# Patient Record
Sex: Male | Born: 1960
Health system: Southern US, Community
[De-identification: ages and names within clinical notes are randomized; demographics above are authoritative.]

## PROBLEM LIST (undated history)

## (undated) DIAGNOSIS — I10 Essential (primary) hypertension: Secondary | ICD-10-CM

## (undated) DIAGNOSIS — E785 Hyperlipidemia, unspecified: Secondary | ICD-10-CM

---

## 1993-09-17 HISTORY — PX: APPENDECTOMY: SHX54

## 1999-07-12 ENCOUNTER — Ambulatory Visit (HOSPITAL_COMMUNITY): Admission: RE | Admit: 1999-07-12 | Discharge: 1999-07-12 | Payer: Self-pay | Admitting: Family Medicine

## 2016-10-01 DIAGNOSIS — Z0000 Encounter for general adult medical examination without abnormal findings: Secondary | ICD-10-CM | POA: Diagnosis not present

## 2016-10-01 DIAGNOSIS — Z789 Other specified health status: Secondary | ICD-10-CM | POA: Diagnosis not present

## 2016-10-01 DIAGNOSIS — R5383 Other fatigue: Secondary | ICD-10-CM | POA: Diagnosis not present

## 2016-10-01 DIAGNOSIS — Z713 Dietary counseling and surveillance: Secondary | ICD-10-CM | POA: Diagnosis not present

## 2016-10-01 DIAGNOSIS — Z79899 Other long term (current) drug therapy: Secondary | ICD-10-CM | POA: Diagnosis not present

## 2016-10-01 DIAGNOSIS — I10 Essential (primary) hypertension: Secondary | ICD-10-CM | POA: Diagnosis not present

## 2016-10-01 DIAGNOSIS — Z299 Encounter for prophylactic measures, unspecified: Secondary | ICD-10-CM | POA: Diagnosis not present

## 2016-10-01 DIAGNOSIS — Z125 Encounter for screening for malignant neoplasm of prostate: Secondary | ICD-10-CM | POA: Diagnosis not present

## 2016-10-01 DIAGNOSIS — E7800 Pure hypercholesterolemia, unspecified: Secondary | ICD-10-CM | POA: Diagnosis not present

## 2016-10-01 DIAGNOSIS — G4700 Insomnia, unspecified: Secondary | ICD-10-CM | POA: Diagnosis not present

## 2017-03-29 DIAGNOSIS — Z1211 Encounter for screening for malignant neoplasm of colon: Secondary | ICD-10-CM | POA: Diagnosis not present

## 2017-03-29 DIAGNOSIS — Z1389 Encounter for screening for other disorder: Secondary | ICD-10-CM | POA: Diagnosis not present

## 2017-03-29 DIAGNOSIS — Z0000 Encounter for general adult medical examination without abnormal findings: Secondary | ICD-10-CM | POA: Diagnosis not present

## 2017-04-22 DIAGNOSIS — G4700 Insomnia, unspecified: Secondary | ICD-10-CM | POA: Diagnosis not present

## 2017-05-07 DIAGNOSIS — M25572 Pain in left ankle and joints of left foot: Secondary | ICD-10-CM | POA: Diagnosis not present

## 2017-05-17 DIAGNOSIS — G4700 Insomnia, unspecified: Secondary | ICD-10-CM | POA: Diagnosis not present

## 2017-06-05 DIAGNOSIS — I10 Essential (primary) hypertension: Secondary | ICD-10-CM | POA: Diagnosis not present

## 2017-06-05 DIAGNOSIS — F5101 Primary insomnia: Secondary | ICD-10-CM | POA: Diagnosis not present

## 2017-06-05 DIAGNOSIS — E782 Mixed hyperlipidemia: Secondary | ICD-10-CM | POA: Diagnosis not present

## 2017-07-12 DIAGNOSIS — E782 Mixed hyperlipidemia: Secondary | ICD-10-CM | POA: Diagnosis not present

## 2017-07-12 DIAGNOSIS — I10 Essential (primary) hypertension: Secondary | ICD-10-CM | POA: Diagnosis not present

## 2017-10-29 DIAGNOSIS — Z23 Encounter for immunization: Secondary | ICD-10-CM | POA: Diagnosis not present

## 2017-10-29 DIAGNOSIS — F5101 Primary insomnia: Secondary | ICD-10-CM | POA: Diagnosis not present

## 2017-10-29 DIAGNOSIS — L309 Dermatitis, unspecified: Secondary | ICD-10-CM | POA: Diagnosis not present

## 2017-12-03 DIAGNOSIS — G4700 Insomnia, unspecified: Secondary | ICD-10-CM | POA: Diagnosis not present

## 2018-03-18 DIAGNOSIS — G4700 Insomnia, unspecified: Secondary | ICD-10-CM | POA: Diagnosis not present

## 2018-05-05 DIAGNOSIS — Z1159 Encounter for screening for other viral diseases: Secondary | ICD-10-CM | POA: Diagnosis not present

## 2018-05-05 DIAGNOSIS — L2082 Flexural eczema: Secondary | ICD-10-CM | POA: Diagnosis not present

## 2018-05-05 DIAGNOSIS — Z0000 Encounter for general adult medical examination without abnormal findings: Secondary | ICD-10-CM | POA: Diagnosis not present

## 2018-05-05 DIAGNOSIS — Z125 Encounter for screening for malignant neoplasm of prostate: Secondary | ICD-10-CM | POA: Diagnosis not present

## 2018-05-05 DIAGNOSIS — E782 Mixed hyperlipidemia: Secondary | ICD-10-CM | POA: Diagnosis not present

## 2018-05-05 DIAGNOSIS — Z23 Encounter for immunization: Secondary | ICD-10-CM | POA: Diagnosis not present

## 2018-05-05 DIAGNOSIS — I10 Essential (primary) hypertension: Secondary | ICD-10-CM | POA: Diagnosis not present

## 2018-10-25 DIAGNOSIS — J101 Influenza due to other identified influenza virus with other respiratory manifestations: Secondary | ICD-10-CM | POA: Diagnosis not present

## 2018-10-31 ENCOUNTER — Emergency Department (HOSPITAL_COMMUNITY): Payer: 59

## 2018-10-31 ENCOUNTER — Encounter (HOSPITAL_COMMUNITY): Payer: Self-pay | Admitting: Emergency Medicine

## 2018-10-31 ENCOUNTER — Emergency Department (HOSPITAL_COMMUNITY)
Admission: EM | Admit: 2018-10-31 | Discharge: 2018-10-31 | Disposition: A | Discharge status: Home / Self Care | Payer: 59 | Attending: Emergency Medicine | Admitting: Emergency Medicine

## 2018-10-31 DIAGNOSIS — R05 Cough: Secondary | ICD-10-CM

## 2018-10-31 DIAGNOSIS — I10 Essential (primary) hypertension: Secondary | ICD-10-CM | POA: Insufficient documentation

## 2018-10-31 DIAGNOSIS — Z79899 Other long term (current) drug therapy: Secondary | ICD-10-CM | POA: Insufficient documentation

## 2018-10-31 DIAGNOSIS — R059 Cough, unspecified: Secondary | ICD-10-CM

## 2018-10-31 DIAGNOSIS — E785 Hyperlipidemia, unspecified: Secondary | ICD-10-CM | POA: Diagnosis not present

## 2018-10-31 DIAGNOSIS — E876 Hypokalemia: Secondary | ICD-10-CM

## 2018-10-31 DIAGNOSIS — R531 Weakness: Secondary | ICD-10-CM | POA: Diagnosis not present

## 2018-10-31 HISTORY — DX: Hyperlipidemia, unspecified: E78.5

## 2018-10-31 HISTORY — DX: Essential (primary) hypertension: I10

## 2018-10-31 LAB — CBC WITH DIFFERENTIAL/PLATELET
Abs Immature Granulocytes: 0.05 10*3/uL (ref 0.00–0.07)
BASOS ABS: 0 10*3/uL (ref 0.0–0.1)
Basophils Relative: 0 %
Eosinophils Absolute: 0 10*3/uL (ref 0.0–0.5)
Eosinophils Relative: 0 %
HCT: 38.3 % — ABNORMAL LOW (ref 39.0–52.0)
Hemoglobin: 12.2 g/dL — ABNORMAL LOW (ref 13.0–17.0)
IMMATURE GRANULOCYTES: 1 %
LYMPHS ABS: 2.2 10*3/uL (ref 0.7–4.0)
Lymphocytes Relative: 28 %
MCH: 26.5 pg (ref 26.0–34.0)
MCHC: 31.9 g/dL (ref 30.0–36.0)
MCV: 83.3 fL (ref 80.0–100.0)
MONOS PCT: 5 %
Monocytes Absolute: 0.4 10*3/uL (ref 0.1–1.0)
NRBC: 0 % (ref 0.0–0.2)
Neutro Abs: 5.2 10*3/uL (ref 1.7–7.7)
Neutrophils Relative %: 66 %
Platelets: 269 10*3/uL (ref 150–400)
RBC: 4.6 MIL/uL (ref 4.22–5.81)
RDW: 12.5 % (ref 11.5–15.5)
WBC: 7.9 10*3/uL (ref 4.0–10.5)

## 2018-10-31 LAB — I-STAT TROPONIN, ED: Troponin i, poc: 0.01 ng/mL (ref 0.00–0.08)

## 2018-10-31 LAB — COMPREHENSIVE METABOLIC PANEL
ALT: 81 U/L — ABNORMAL HIGH (ref 0–44)
AST: 81 U/L — ABNORMAL HIGH (ref 15–41)
Albumin: 3.2 g/dL — ABNORMAL LOW (ref 3.5–5.0)
Alkaline Phosphatase: 86 U/L (ref 38–126)
Anion gap: 14 (ref 5–15)
BUN: 8 mg/dL (ref 6–20)
CO2: 27 mmol/L (ref 22–32)
Calcium: 8.9 mg/dL (ref 8.9–10.3)
Chloride: 92 mmol/L — ABNORMAL LOW (ref 98–111)
Creatinine, Ser: 1.11 mg/dL (ref 0.61–1.24)
GFR calc Af Amer: >60 mL/min (ref >60)
GFR calc non Af Amer: >60 mL/min (ref >60)
Glucose, Bld: 126 mg/dL — ABNORMAL HIGH (ref 70–99)
Potassium: 2.4 mmol/L — CL (ref 3.5–5.1)
Sodium: 133 mmol/L — ABNORMAL LOW (ref 135–145)
Total Bilirubin: 1.1 mg/dL (ref 0.3–1.2)
Total Protein: 7.9 g/dL (ref 6.5–8.1)

## 2018-10-31 LAB — MAGNESIUM: Magnesium: 2.5 mg/dL — ABNORMAL HIGH (ref 1.7–2.4)

## 2018-10-31 LAB — BASIC METABOLIC PANEL
Anion gap: 15 (ref 5–15)
BUN: 8 mg/dL (ref 6–20)
CO2: 28 mmol/L (ref 22–32)
Calcium: 9 mg/dL (ref 8.9–10.3)
Chloride: 89 mmol/L — ABNORMAL LOW (ref 98–111)
Creatinine, Ser: 1.09 mg/dL (ref 0.61–1.24)
GFR calc Af Amer: >60 mL/min (ref >60)
GLUCOSE: 95 mg/dL (ref 70–99)
Potassium: 2.9 mmol/L — ABNORMAL LOW (ref 3.5–5.1)
Sodium: 132 mmol/L — ABNORMAL LOW (ref 135–145)

## 2018-10-31 MED ORDER — POTASSIUM CHLORIDE CRYS ER 20 MEQ PO TBCR
40.0000 meq | EXTENDED_RELEASE_TABLET | Freq: Once | ORAL | Status: AC
  Administered 2018-10-31: 40 meq via ORAL
  Filled 2018-10-31: qty 2

## 2018-10-31 MED ORDER — POTASSIUM CHLORIDE 10 MEQ/100ML IV SOLN
10.0000 meq | Freq: Once | INTRAVENOUS | Status: AC
  Administered 2018-10-31: 10 meq via INTRAVENOUS
  Filled 2018-10-31: qty 100

## 2018-10-31 MED ORDER — MAGNESIUM SULFATE 2 GM/50ML IV SOLN
2.0000 g | Freq: Once | INTRAVENOUS | Status: DC
  Filled 2018-10-31: qty 50

## 2018-10-31 MED ORDER — SODIUM CHLORIDE 0.9 % IV BOLUS
1000.0000 mL | Freq: Once | INTRAVENOUS | Status: AC
  Administered 2018-10-31: 1000 mL via INTRAVENOUS

## 2018-10-31 MED ORDER — POTASSIUM CHLORIDE 10 MEQ/100ML IV SOLN: 10.0000 meq | Freq: Once | INTRAVENOUS | Status: DC

## 2018-10-31 MED ORDER — DOXYCYCLINE HYCLATE 50 MG PO CAPS: 50.0000 mg | ORAL_CAPSULE | Freq: Two times a day (BID) | ORAL | 0 refills | Status: AC

## 2018-10-31 MED ORDER — POTASSIUM CHLORIDE ER 10 MEQ PO TBCR: 20.0000 meq | EXTENDED_RELEASE_TABLET | Freq: Two times a day (BID) | ORAL | 0 refills | Status: AC

## 2018-10-31 MED ORDER — AMOXICILLIN 500 MG PO CAPS: 1000.0000 mg | ORAL_CAPSULE | Freq: Two times a day (BID) | ORAL | 0 refills | Status: AC

## 2018-10-31 NOTE — Discharge Instructions (Addendum)
You have been seen today for cough and low potassium. Please read and follow all provided instructions.   1. Medications: Amoxicillin and Doxycyline (antibiotics), potassium, usual home medications 2. Treatment: rest, drink plenty of fluids 3. Follow Up: Please follow up with your primary doctor in 2-3 days for discussion of your diagnoses and further evaluation after today's visit; if you do not have a primary care doctor use the resource guide provided to find one; Please return to the ER for any new or worsening symptoms. Please obtain all of your results from medical records or have your doctors office obtain the results - share them with your doctor - you should be seen at your doctors office. Call today to arrange your follow up.   Take medications as prescribed. Please review all of the medicines and only take them if you do not have an allergy to them. Return to the emergency room for worsening condition or new concerning symptoms. Follow up with your regular doctor. If you don't have a regular doctor use one of the numbers below to establish a primary care doctor.  Please be aware that if you are taking birth control pills, taking other prescriptions, ESPECIALLY ANTIBIOTICS may make the birth control ineffective - if this is the case, either do not engage in sexual activity or use alternative methods of birth control such as condoms until you have finished the medicine and your family doctor says it is OK to restart them. If you are on a blood thinner such as COUMADIN, be aware that any other medicine that you take may cause the coumadin to either work too much, or not enough - you should have your coumadin level rechecked in next 7 days if this is the case.  ?  It is also a possibility that you have an allergic reaction to any of the medicines that you have been prescribed - Everybody reacts differently to medications and while MOST people have no trouble with most medicines, you may have a  reaction such as nausea, vomiting, rash, swelling, shortness of breath. If this is the case, please stop taking the medicine immediately and contact your physician.  ?  You should return to the ER if you develop severe or worsening symptoms.   Emergency Department Resource Guide 1) Find a Doctor and Pay Out of Pocket Although you won't have to find out who is covered by your insurance plan, it is a good idea to ask around and get recommendations. You will then need to call the office and see if the doctor you have chosen will accept you as a new patient and what types of options they offer for patients who are self-pay. Some doctors offer discounts or will set up payment plans for their patients who do not have insurance, but you will need to ask so you aren't surprised when you get to your appointment.  2) Contact Your Local Health Department Not all health departments have doctors that can see patients for sick visits, but many do, so it is worth a call to see if yours does. If you don't know where your local health department is, you can check in your phone book. The CDC also has a tool to help you locate your state's health department, and many state websites also have listings of all of their local health departments.  3) Find a Walk-in Clinic If your illness is not likely to be very severe or complicated, you may want to try a walk in  clinic. These are popping up all over the country in pharmacies, drugstores, and shopping centers. They're usually staffed by nurse practitioners or physician assistants that have been trained to treat common illnesses and complaints. They're usually fairly quick and inexpensive. However, if you have serious medical issues or chronic medical problems, these are probably not your best option.  No Primary Care Doctor: Call Health Connect at  [phone] - they can help you locate a primary care doctor that  accepts your insurance, provides certain services,  etc. Physician Referral [phone]  Emergency Department Resource Guide 1) Find a Doctor and Pay Out of Pocket Although you won't have to find out who is covered by your insurance plan, it is a good idea to ask around and get recommendations. You will then need to call the office and see if the doctor you have chosen will accept you as a new patient and what types of options they offer for patients who are self-pay. Some doctors offer discounts or will set up payment plans for their patients who do not have insurance, but you will need to ask so you aren't surprised when you get to your appointment.  2) Contact Your Local Health Department Not all health departments have doctors that can see patients for sick visits, but many do, so it is worth a call to see if yours does. If you don't know where your local health department is, you can check in your phone book. The CDC also has a tool to help you locate your state's health department, and many state websites also have listings of all of their local health departments.  3) Find a Walk-in Clinic If your illness is not likely to be very severe or complicated, you may want to try a walk in clinic. These are popping up all over the country in pharmacies, drugstores, and shopping centers. They're usually staffed by nurse practitioners or physician assistants that have been trained to treat common illnesses and complaints. They're usually fairly quick and inexpensive. However, if you have serious medical issues or chronic medical problems, these are probably not your best option.  No Primary Care Doctor: Call Health Connect at  [phone] - they can help you locate a primary care doctor that  accepts your insurance, provides certain services, etc. Physician Referral Service- [phone]  Chronic Pain Problems: Organization         Address  Phone   Notes  [facility]  [phone] Patients need to be referred by their  primary care doctor.   Medication Assistance: Organization         Address  Phone   Notes  [facility] Medication [facility] [address] [place] [place], [place] [address] [phone] --Must be a resident of [facility] -- Must have NO insurance coverage whatsoever (no Medicaid/ Medicare, etc.) -- The pt. MUST have a primary care doctor that directs their care regularly and follows them in the community   [facility]  [phone]   [employer]  [phone]    Agencies that provide inexpensive medical care: Organization         Address  Phone   Notes  [facility]  [phone]   [facility]    [phone]   [facility] [address] [place], [place] [address] [phone]   [facility] [address] [place] [place] [phone]   [facility]    (  [phone]   [facility]    [phone]   [facility]  [address], [place] Phone:  [phone], Fax:  [phone] Hours of Operation:  9 am - 6 pm, M-F.  Also accepts Medicaid/Medicare and self-pay.  [facility] for [address], [address], [place] Phone: [phone], Fax: [phone]. Hours of Operation:  8:30 am - 5:30 pm, M-F.  Also accepts Medicaid and self-pay.  [facility] [place] [address] [place] Phone: [phone]   [place], [place], [place] [phone], Ext. [phone] Mondays & Thursdays: 7-9 AM.  First 15 patients are seen on a first come, first serve basis.    [place] Providers:  Organization         Address  Phone   Notes  [facility] [address], [place] [phone] Also accepts self-pay patients.  [facility] [address], [place]  [phone]   [place], [address], [place] [phone]   [facility] [address] [place] [phone]   Lucianne Lei [address], [place]   [phone] Only accepts [place] [facility] [place] patients after they have their name applied to their card.   Self-Pay (no insurance) in [facility]:  Organization         Address  Phone   Notes  Sickle Cell Patients, [facility] [place] [phone]   [facility] [place] [phone]   [facility] [place]  [place], [place], [place] [phone]   [facility]/Dr. Osei-Bonsu  [address], [place] or [address], [place] [phone] Phone number for both [place] and [place] locations is the same.  [facility] and [facility] [address] [place] [phone]   [facility] [address], [place] or [address] [phone]   [facility] [address] [place] [phone], phone; [phone], fax Sees patients 1st and 3rd Saturday of every month.  Must not qualify for public or private insurance (i.e. Medicaid, Medicare, [place] Health Choice, Veterans' Benefits)  Household income should be no more than 200% of the poverty level The clinic cannot treat you if you are pregnant or think you are pregnant  Sexually transmitted diseases are not treated at the clinic.

## 2018-10-31 NOTE — Progress Notes (Signed)
PIV consult: IV site established by ED RN.

## 2018-10-31 NOTE — ED Notes (Signed)
Patient unable to provide urine sample at this time - provided water.

## 2018-10-31 NOTE — ED Triage Notes (Signed)
Patient c/o fatigue, dry, non-productive cough, sinus pressure/headache, nausea, and congestion x 1 week, no relief with OTC medications. Denies fevers/chills. Resp e/u, skin w/d.

## 2018-10-31 NOTE — ED Provider Notes (Signed)
[facility] Provider Note   CSN: [id number] Arrival date & time: 10/31/18  1030     History   Chief Complaint Chief Complaint  Patient presents with  . Fatigue  . Cough    HPI Phillip Lara is a 58 y.o. male with a PMH of HTN and HLD presenting with a dry cough, congestion, rhinorrhea, and fatigue onset 1 week ago. Patient reports loss of appetite. Wife is a contributing historian. Patient reports intermittent frontal headaches described as sinus pressure and states symptoms are similar to previous headaches. Patient denies a current headache. Patient denies dizziness, weakness, or numbness. Patient reports taking Alka-seltzer, Motrin, and Nyquil with minimal relief. Patient reports a few episodes of diarrhea a few days ago, but states symptoms resolved. Patient denies fever, chills, body aches, or sore throat. Patient reports intermittent nausea a few days ago, but states this has resolved as well. Patient denies vomiting, or abdominal pain. Patient denies dysuria or frequency. Patient denies shortness of breath or chest pain. Patient states he has had sick exposures at work. Patient reports he was evaluated on the phone by his company's physician and states he was prescribed Tamiflu. Patient reports he started Tamiflu on 2/9.  HPI  Past Medical History:  Diagnosis Date  . Hyperlipidemia   . Hypertension     There are no active problems to display for this patient.   Past Surgical History:  Procedure Laterality Date  . APPENDECTOMY  1995        Home Medications    Prior to Admission medications   Medication Sig Start Date End Date Taking? Authorizing Provider  acetaminophen (TYLENOL) 500 MG tablet Take 1,000 mg by mouth every 6 (six) hours as needed for mild pain or headache.   Yes [provider]  diphenhydramine-acetaminophen (TYLENOL PM) 25-500 MG TABS tablet Take 1 tablet by mouth at bedtime as needed (sleep).   Yes  [provider]  DM-Doxylamine-Acetaminophen (NYQUIL HBP COLD & FLU) 15-6.25-325 MG/15ML LIQD Take 15 mLs by mouth every 6 (six) hours as needed (flu symptoms).   Yes [provider]  ibuprofen (ADVIL,MOTRIN) 200 MG tablet Take 200 mg by mouth every 6 (six) hours as needed for headache or moderate pain.   Yes [provider]  lisinopril-hydrochlorothiazide (PRINZIDE,ZESTORETIC) 20-25 MG tablet Take 1 tablet by mouth daily. 09/20/18  Yes [provider]  loperamide (IMODIUM) 2 MG capsule Take 4 mg by mouth as needed for diarrhea or loose stools.   Yes [provider]  lovastatin (MEVACOR) 20 MG tablet Take 20 mg by mouth daily. With evening meal 09/22/18  Yes [provider]  oseltamivir (TAMIFLU) 75 MG capsule Take 75 mg by mouth 2 (two) times daily. 10/25/18  Yes [provider]  vitamin C (ASCORBIC ACID) 500 MG tablet Take 500 mg by mouth daily.   Yes [provider]  amoxicillin (AMOXIL) 500 MG capsule Take 2 capsules (1,000 mg total) by mouth 2 (two) times daily for 10 days. 10/31/18 11/10/18  Carlyle BasquesHernandez, Ruthann Angulo P, PA-C  doxycycline (VIBRAMYCIN) 50 MG capsule Take 1 capsule (50 mg total) by mouth 2 (two) times daily for 7 days. 10/31/18 11/07/18  Carlyle BasquesHernandez, Shakeda Pearse P, PA-C  potassium chloride (K-DUR) 10 MEQ tablet Take 2 tablets (20 mEq total) by mouth 2 (two) times daily for 5 days. 10/31/18 11/05/18  Leretha DykesHernandez, Yarden Hillis P, PA-C    Family History No family history on file.  Social History Social History   Tobacco Use  .  Smoking status: Never Smoker  . Smokeless tobacco: Never Used  Substance Use Topics  . Alcohol use: Yes    Comment: rare  . Drug use: Never     Allergies   Patient has no known allergies.   Review of Systems Review of Systems  Constitutional: Positive for appetite change and fatigue. Negative for activity change, chills, diaphoresis and fever.  HENT: Positive for congestion and rhinorrhea. Negative for ear pain,  postnasal drip and sore throat.   Eyes: Negative for photophobia, pain, redness, itching and visual disturbance.  Respiratory: Positive for cough. Negative for shortness of breath.   Cardiovascular: Negative for chest pain.  Gastrointestinal: Positive for diarrhea and nausea. Negative for abdominal pain, blood in stool, constipation and vomiting.  Genitourinary: Negative for dysuria and frequency.  Musculoskeletal: Negative for myalgias, neck pain and neck stiffness.  Skin: Negative for rash.  Allergic/Immunologic: Negative for environmental allergies and immunocompromised state.  Neurological: Positive for headaches. Negative for dizziness, syncope, speech difficulty, weakness and numbness.     Physical Exam Updated Vital Signs BP 119/70 (BP Location: Right Arm)   Pulse 80   Temp 98 F (36.7 C) (Oral)   Resp 17   SpO2 96%   Physical Exam Vitals signs and nursing note reviewed.  Constitutional:      General: He is not in acute distress.    Appearance: He is well-developed. He is not diaphoretic.  HENT:     Head: Normocephalic and atraumatic.     Right Ear: Tympanic membrane and external ear normal. No middle ear effusion.     Left Ear: Tympanic membrane and external ear normal.  No middle ear effusion.     Nose: Mucosal edema, congestion and rhinorrhea present.     Right Sinus: No maxillary sinus tenderness or frontal sinus tenderness.     Left Sinus: No maxillary sinus tenderness or frontal sinus tenderness.     Mouth/Throat:     Mouth: Mucous membranes are moist.     Pharynx: Uvula midline. Posterior oropharyngeal erythema present. No oropharyngeal exudate.  Eyes:     General:        Right eye: No discharge.        Left eye: No discharge.     Extraocular Movements: Extraocular movements intact.     Conjunctiva/sclera: Conjunctivae normal.     Pupils: Pupils are equal, round, and reactive to light.  Neck:     Musculoskeletal: Normal range of motion and neck supple.    Cardiovascular:     Rate and Rhythm: Normal rate and regular rhythm.     Heart sounds: Normal heart sounds. No murmur. No friction rub. No gallop.   Pulmonary:     Effort: Pulmonary effort is normal. No respiratory distress.     Breath sounds: Examination of the right-middle field reveals decreased breath sounds. Examination of the right-lower field reveals decreased breath sounds. Decreased breath sounds present. No wheezing, rhonchi or rales.  Abdominal:     Palpations: Abdomen is soft.     Tenderness: There is no abdominal tenderness.  Musculoskeletal: Normal range of motion.  Lymphadenopathy:     Cervical: No cervical adenopathy.  Skin:    Findings: No rash.  Neurological:     Mental Status: He is alert and oriented to person, place, and time.    Mental Status:  Alert, oriented, thought content appropriate, able to give a coherent history. Speech fluent without evidence of aphasia. Able to follow 2 step commands without difficulty.  Cranial  Nerves:  II:  Peripheral visual fields grossly normal, pupils equal, round, reactive to light III,IV, VI: ptosis not present, extra-ocular motions intact bilaterally  V,VII: smile symmetric, facial light touch sensation equal VIII: hearing grossly normal to voice  X: uvula elevates symmetrically  XI: bilateral shoulder shrug symmetric and strong XII: midline tongue extension without fassiculations Motor:  Normal tone. 5/5 in upper and lower extremities bilaterally including strong and equal grip strength and dorsiflexion/plantar flexion Sensory: light touch normal in all extremities.  Deep Tendon Reflexes: 2+ and symmetric in the biceps and patella Cerebellar: normal finger-to-nose with bilateral upper extremities Gait: normal gait and balance.  Negative pronator drift. Negative Romberg sign. CV: distal pulses palpable throughout   ED Treatments / Results  Labs (all labs ordered are listed, but only abnormal results are  displayed) Labs Reviewed  COMPREHENSIVE METABOLIC PANEL - Abnormal; Notable for the following components:      Result Value   Sodium 133 (*)    Potassium 2.4 (*)    Chloride 92 (*)    Glucose, Bld 126 (*)    Albumin 3.2 (*)    AST 81 (*)    ALT 81 (*)    All other components within normal limits  CBC WITH DIFFERENTIAL/PLATELET - Abnormal; Notable for the following components:   Hemoglobin 12.2 (*)    HCT 38.3 (*)    All other components within normal limits  MAGNESIUM - Abnormal; Notable for the following components:   Magnesium 2.5 (*)    All other components within normal limits  BASIC METABOLIC PANEL - Abnormal; Notable for the following components:   Sodium 132 (*)    Potassium 2.9 (*)    Chloride 89 (*)    All other components within normal limits  URINALYSIS, ROUTINE W REFLEX MICROSCOPIC  I-STAT TROPONIN, ED    EKG EKG Interpretation  Date/Time:  Friday October 31 2018 13:26:41 EST Ventricular Rate:  69 PR Interval:  132 QRS Duration: 70 QT Interval:  420 QTC Calculation: 450 R Axis:   66 Text Interpretation:  Normal sinus rhythm T wave abnormality, consider inferior ischemia Abnormal ECG No old tracing to compare Confirmed by Pricilla LovelessGoldston, Scott [phone]([id number]) on 10/31/2018 1:32:00 PM   Radiology Dg Chest 2 View  Result Date: 10/31/2018 CLINICAL DATA:  Cough. EXAM: CHEST - 2 VIEW COMPARISON:  None. FINDINGS: The heart size and mediastinal contours are within normal limits. No pneumothorax or pleural effusion is noted. Mild right middle lobe subsegmental atelectasis or infiltrate is noted. Minimal left basilar subsegmental atelectasis is noted. The visualized skeletal structures are unremarkable. IMPRESSION: Mild right middle lobe subsegmental atelectasis or infiltrate is noted. Minimal left basilar atelectasis is noted. Electronically Signed   By: Lupita RaiderJames  Green Jr, M.D.   On: 10/31/2018 11:58    Procedures Procedures (including critical care time)  Medications Ordered in  ED Medications  potassium chloride 10 mEq in 100 mL IVPB (has no administration in time range)  potassium chloride 10 mEq in 100 mL IVPB (10 mEq Intravenous New Bag/Given 10/31/18 1416)  potassium chloride SA (K-DUR,KLOR-CON) CR tablet 40 mEq (40 mEq Oral Given 10/31/18 1246)  sodium chloride 0.9 % bolus 1,000 mL (1,000 mLs Intravenous New Bag/Given 10/31/18 1415)     Initial Impression / Assessment and Plan / ED Course  I have reviewed the triage vital signs and the nursing notes.  Pertinent labs & imaging results that were available during my care of the patient were reviewed by me and considered in my  medical decision making (see chart for details).  Clinical Course as of Nov 01 1607  Fri Oct 31, 2018  1149 Low hemoglobin at 12.2. No old labs to compare available. Advised patient to follow up with PCP to monitor hemoglobin.  Hemoglobin(!): 12.2 [AH]  1149 WBCs within normal limits.  WBC: 7.9 [AH]  1204 Mild right middle lobe subsegmental atelectasis or infiltrate is noted. Minimal left basilar atelectasis is noted on CXR.    DG Chest 2 View [AH]  1335 Hypokalemia noted at 2.4. Supplemented potassium.   Potassium(!!): 2.4 [AH]  1528 Potassium improved to 2.9. Will provide additional potassium.  Potassium(!): 2.9 [AH]    Clinical Course User Index [AH] Leretha Dykes, PA-C   Patient presents with cough. CXR reveals a right middle lobe infiltrate. Will prescribe amoxicillin and doxycyline due to concern for pneumonia. Patient also has significant hypokalemia at 2.4. Suspect hypokalemia is likely due to recent diarrhea. Potassium was supplemented. EKG does not reveal QT prolongation. Potassium improved to 2.9. Patient has been stable while in the ER. Provided IVF. Will prescribe Potassium for 5 days for hypokalemia and advised patient to follow up with PCP on Monday to recheck potassium.  Discussed return precautions with patient. Patient will be discharged to home. Patient states he  understands and agrees with plan.   Findings and plan of care discussed with supervising physician Dr. Criss Alvine who personally evaluated and examined this patient.  Final Clinical Impressions(s) / ED Diagnoses   Final diagnoses:  Hypokalemia  Cough    ED Discharge Orders         Ordered    potassium chloride (K-DUR) 10 MEQ tablet  2 times daily     10/31/18 1559    amoxicillin (AMOXIL) 500 MG capsule  2 times daily     10/31/18 1559    doxycycline (VIBRAMYCIN) 50 MG capsule  2 times daily     10/31/18 1559           Leretha Dykes, [place] 10/31/18 1620    Pricilla Loveless, MD 11/01/18 [phone]

## 2018-10-31 NOTE — ED Notes (Signed)
Pt aware a urine sample is needed pt states he cant urinate as this time

## 2019-02-16 ENCOUNTER — Other Ambulatory Visit: Payer: Self-pay | Admitting: Family Medicine

## 2019-02-16 DIAGNOSIS — N6310 Unspecified lump in the right breast, unspecified quadrant: Secondary | ICD-10-CM

## 2019-03-03 ENCOUNTER — Ambulatory Visit: Payer: 59

## 2019-03-03 ENCOUNTER — Ambulatory Visit
Admission: RE | Admit: 2019-03-03 | Discharge: 2019-03-03 | Disposition: A | Discharge status: Home / Self Care | Payer: 59 | Source: Ambulatory Visit | Attending: Family Medicine | Admitting: Family Medicine

## 2019-03-03 DIAGNOSIS — N6310 Unspecified lump in the right breast, unspecified quadrant: Secondary | ICD-10-CM

## 2019-11-26 ENCOUNTER — Ambulatory Visit: Payer: 59 | Attending: Internal Medicine

## 2019-11-26 DIAGNOSIS — Z23 Encounter for immunization: Secondary | ICD-10-CM

## 2019-11-26 NOTE — Progress Notes (Signed)
   Covid-19 Vaccination Clinic  Name:  Phillip Lara    MRN: [id number] DOB: [birth year]  11/26/2019  [name] was observed post Covid-19 immunization for 15 minutes without incident. He was provided with Vaccine Information Sheet and instruction to access the V-Safe system.   [name] was instructed to call 911 with any severe reactions post vaccine: Marland Kitchen Difficulty breathing  . Swelling of face and throat  . A fast heartbeat  . A bad rash all over body  . Dizziness and weakness   Immunizations Administered    Name Date Dose VIS Date Route   Moderna COVID-19 Vaccine 11/26/2019 11:43 AM 0.5 mL 08/18/2019 Intramuscular   Manufacturer: Moderna   Lot: [id number]   NDC: 12197-588-32

## 2019-12-29 ENCOUNTER — Ambulatory Visit: Payer: 59 | Attending: Internal Medicine

## 2019-12-29 DIAGNOSIS — Z23 Encounter for immunization: Secondary | ICD-10-CM

## 2019-12-29 NOTE — Progress Notes (Signed)
   Covid-19 Vaccination Clinic  Name:  Phillip Lara    MRN: [id number] DOB: 10-17-1960  12/29/2019  [name] was observed post Covid-19 immunization for 15 minutes without incident. He was provided with Vaccine Information Sheet and instruction to access the V-Safe system.   [name] was instructed to call 911 with any severe reactions post vaccine: Marland Kitchen Difficulty breathing  . Swelling of face and throat  . A fast heartbeat  . A bad rash all over body  . Dizziness and weakness   Immunizations Administered    Name Date Dose VIS Date Route   Moderna COVID-19 Vaccine 12/29/2019  9:16 AM 0.5 mL 08/18/2019 Intramuscular   Manufacturer: Gala Murdoch   Lot: [id number]   NDC: 41962-229-79      Covid-19 Vaccination Clinic  Name:  Phillip Lara    MRN: [id number] DOB: August 01, 1961  12/29/2019  [name] was observed post Covid-19 immunization for 15 minutes without incident. He was provided with Vaccine Information Sheet and instruction to access the V-Safe system.   [name] was instructed to call 911 with any severe reactions post vaccine: Marland Kitchen Difficulty breathing  . Swelling of face and throat  . A fast heartbeat  . A bad rash all over body  . Dizziness and weakness   Immunizations Administered    Name Date Dose VIS Date Route   Moderna COVID-19 Vaccine 12/29/2019  9:16 AM 0.5 mL 08/18/2019 Intramuscular   Manufacturer: Moderna   Lot: [id number]   NDC: 18563-149-70

## 2020-01-17 IMAGING — DX DG CHEST 2V
2 series · 2 of 2 positions shown · non-contrast
Comparison: None.

CLINICAL DATA: Cough.

EXAM:
CHEST - 2 VIEW

[chest pa]
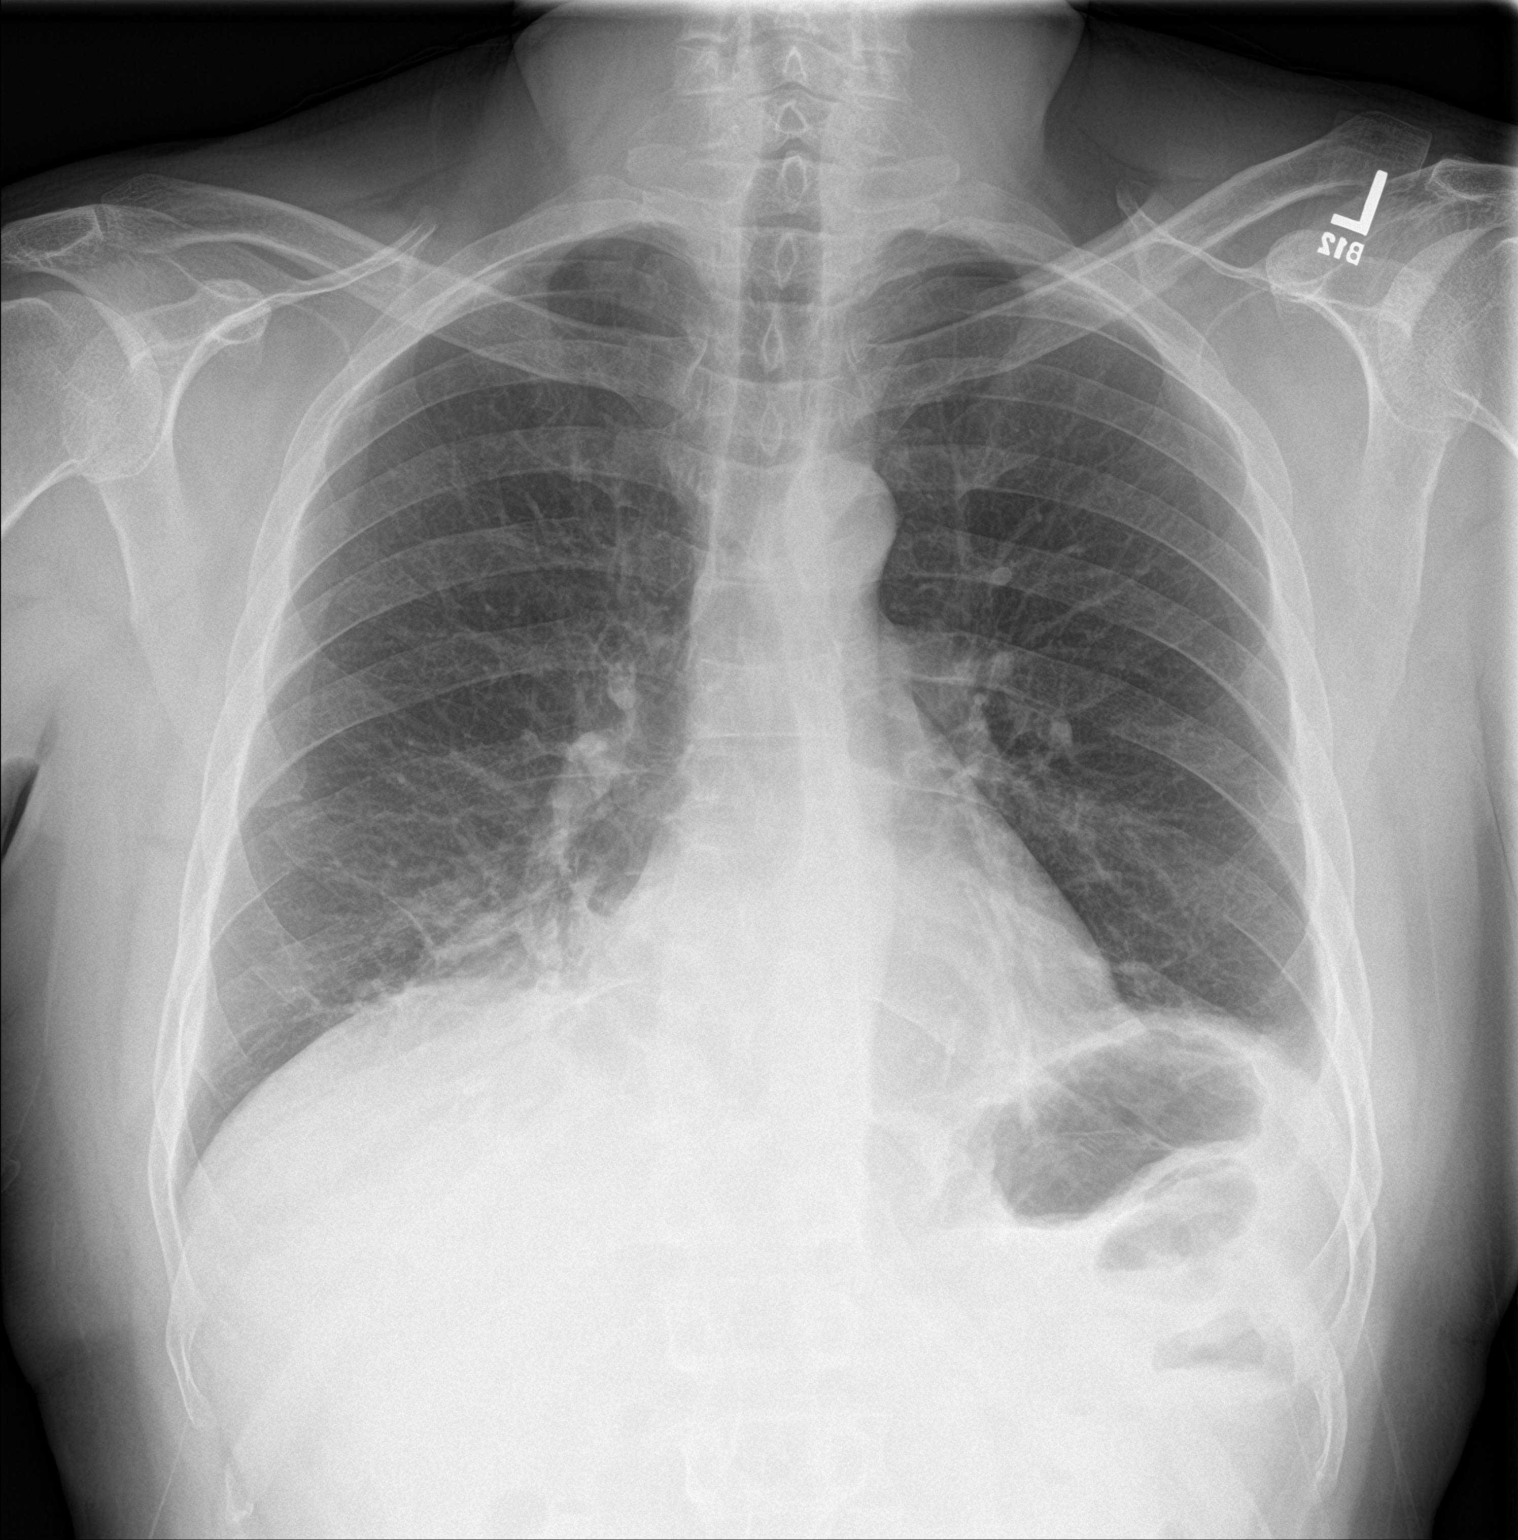

[chest lat]
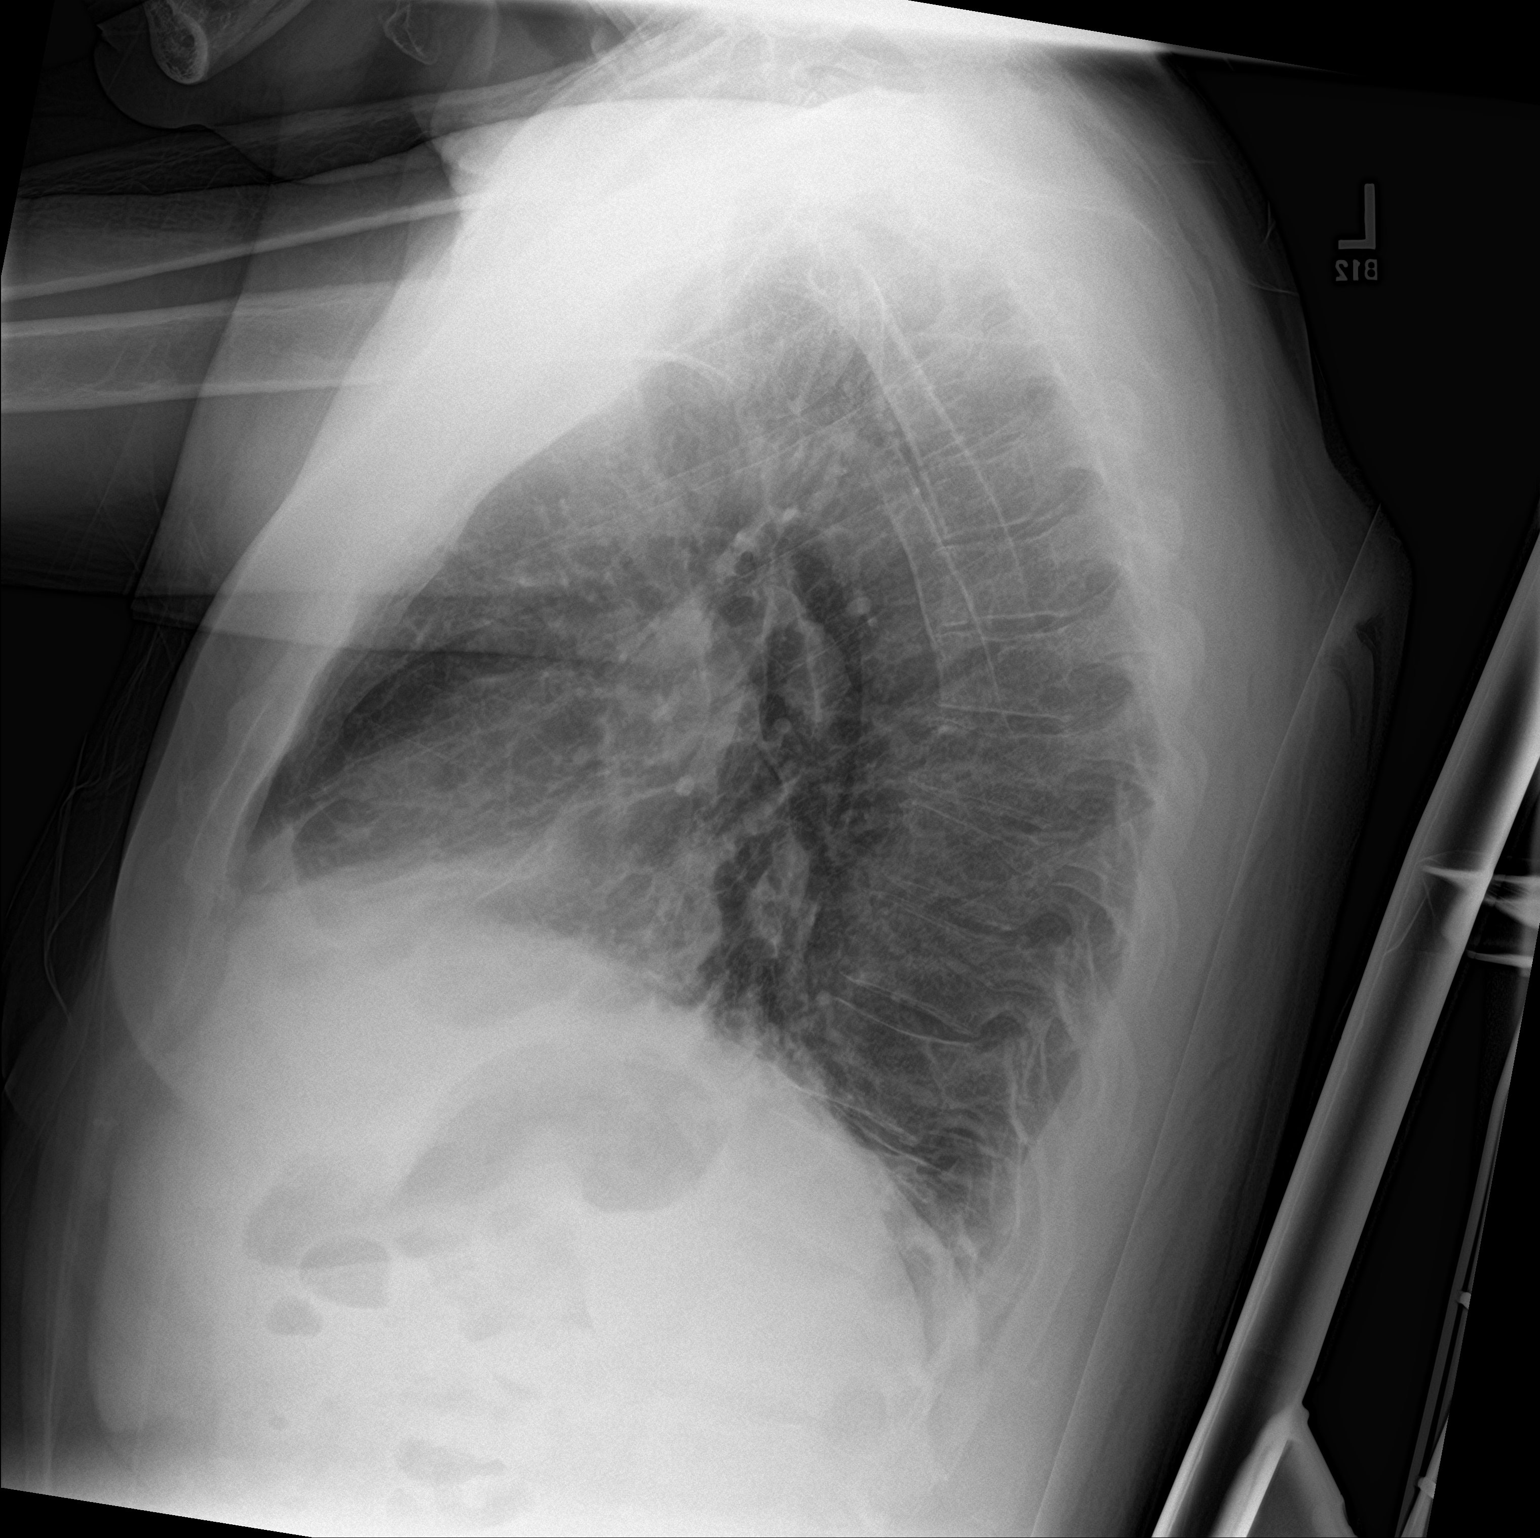

[2 of 2 positions shown; findings below may reference images not displayed]

FINDINGS: The heart size and mediastinal contours are within normal limits. No
pneumothorax or pleural effusion is noted. Mild right middle lobe
subsegmental atelectasis or infiltrate is noted. Minimal left
basilar subsegmental atelectasis is noted. The visualized skeletal
structures are unremarkable.
IMPRESSION: Mild right middle lobe subsegmental atelectasis or infiltrate is
noted. Minimal left basilar atelectasis is noted.

## 2020-04-15 ENCOUNTER — Ambulatory Visit
Admission: EM | Admit: 2020-04-15 | Discharge: 2020-04-15 | Disposition: A | Discharge status: Home / Self Care | Payer: 59

## 2020-04-15 ENCOUNTER — Other Ambulatory Visit: Payer: Self-pay

## 2020-04-15 DIAGNOSIS — M7702 Medial epicondylitis, left elbow: Secondary | ICD-10-CM | POA: Diagnosis not present

## 2020-04-15 DIAGNOSIS — M25522 Pain in left elbow: Secondary | ICD-10-CM

## 2020-04-15 NOTE — Discharge Instructions (Addendum)
Check out Phillip Lara and Phillip Lara on youtube for tennis elbow  I would get an elbow sleeve for compression and support. Wear this while you are active and when you are sleeping.  May use ice, naproxen, and voltaren gel topically to the area.  Follow up with this office or with primary care as needed  Follow up with sports medicine if symptoms are persisting

## 2020-04-15 NOTE — ED Triage Notes (Signed)
Pt has had intermittent elbow pain after injury years ago, began hurting again in May. States usually got better but is still painful

## 2020-04-15 NOTE — ED Provider Notes (Signed)
[facility]   [id number] 04/15/20 Arrival Time: 1744  IO:EVOJJ PAIN  SUBJECTIVE: History from: patient. Phillip Lara is a 59 y.o. male complains of left elbow pain that has been bothering him on and off for the last 2 months. Reports that he has an old injury to this elbow and that it flares up every now and then. Reports that he has taken tylenol once with temporary relief. Localizes pain to medial aspect of the elbow. Describes the pain as  intermittent and achy in character. Symptoms are made worse with activity. Denies similar symptoms in the past. Denies fever, chills, erythema, ecchymosis, effusion, weakness, numbness and tingling, saddle paresthesias, loss of bowel or bladder function.      ROS: As per HPI.  All other pertinent ROS negative.     Past Medical History:  Diagnosis Date  . Hyperlipidemia   . Hypertension    Past Surgical History:  Procedure Laterality Date  . APPENDECTOMY  1995   No Known Allergies No current facility-administered medications on file prior to encounter.   Current Outpatient Medications on File Prior to Encounter  Medication Sig Dispense Refill  . acetaminophen (TYLENOL) 500 MG tablet Take 1,000 mg by mouth every 6 (six) hours as needed for mild pain or headache.    . diphenhydramine-acetaminophen (TYLENOL PM) 25-500 MG TABS tablet Take 1 tablet by mouth at bedtime as needed (sleep).    . DM-Doxylamine-Acetaminophen (NYQUIL HBP COLD & FLU) 15-6.25-325 MG/15ML LIQD Take 15 mLs by mouth every 6 (six) hours as needed (flu symptoms).    Marland Kitchen ibuprofen (ADVIL,MOTRIN) 200 MG tablet Take 200 mg by mouth every 6 (six) hours as needed for headache or moderate pain.    Marland Kitchen lisinopril-hydrochlorothiazide (PRINZIDE,ZESTORETIC) 20-25 MG tablet Take 1 tablet by mouth daily.    Marland Kitchen loperamide (IMODIUM) 2 MG capsule Take 4 mg by mouth as needed for diarrhea or loose stools.    . lovastatin (MEVACOR) 20 MG tablet Take 20 mg by mouth daily. With evening meal      . oseltamivir (TAMIFLU) 75 MG capsule Take 75 mg by mouth 2 (two) times daily.    . potassium chloride (K-DUR) 10 MEQ tablet Take 2 tablets (20 mEq total) by mouth 2 (two) times daily for 5 days. 20 tablet 0  . vitamin C (ASCORBIC ACID) 500 MG tablet Take 500 mg by mouth daily.     Social History   Socioeconomic History  . Marital status: Married    Spouse name: Not on file  . Number of children: Not on file  . Years of education: Not on file  . Highest education level: Not on file  Occupational History  . Not on file  Tobacco Use  . Smoking status: Never Smoker  . Smokeless tobacco: Never Used  Substance and Sexual Activity  . Alcohol use: Yes    Comment: rare  . Drug use: Never  . Sexual activity: Not on file  Other Topics Concern  . Not on file  Social History Narrative  . Not on file   Social Determinants of Health   Financial Resource Strain:   . Difficulty of Paying Living Expenses:   Food Insecurity:   . Worried About Programme researcher, broadcasting/film/video in the Last Year:   . Barista in the Last Year:   Transportation Needs:   . Freight forwarder (Medical):   Marland Kitchen Lack of Transportation (Non-Medical):   Physical Activity:   . Days of Exercise per Week:   .  Minutes of Exercise per Session:   Stress:   . Feeling of Stress :   Social Connections:   . Frequency of Communication with Friends and Family:   . Frequency of Social Gatherings with Friends and Family:   . Attends Religious Services:   . Active Member of Clubs or Organizations:   . Attends Banker Meetings:   Marland Kitchen Marital Status:   Intimate Partner Violence:   . Fear of Current or Ex-Partner:   . Emotionally Abused:   Marland Kitchen Physically Abused:   . Sexually Abused:    Family History  Problem Relation Age of Onset  . Breast cancer Mother        over 89  . Breast cancer Sister        over 48     OBJECTIVE:  Vitals:   04/15/20 1757  BP: (!) 130/81  Pulse: 65  Resp: 18  Temp: 98.8 F (37.1  C)  SpO2: 95%    General appearance: ALERT; in no acute distress.  Head: NCAT Lungs: Normal respiratory effort CV:  pulses 2+ bilaterally. Cap refill < 2 seconds Musculoskeletal:  Inspection: Skin warm, dry, clear and intact without obvious erythema, effusion, or ecchymosis.  Palpation: Nontender to palpation ROM: FROM active and passive Skin: warm and dry Neurologic: Ambulates without difficulty; Sensation intact about the upper/ lower extremities Psychological: alert and cooperative; normal mood and affect  DIAGNOSTIC STUDIES:  No results found.   ASSESSMENT & PLAN:  1. Medial epicondylitis of elbow, left   2. Left elbow pain     May use elbow sleeve May use topical voltaren gel Golfer's elbow information and rehab exercises handouts given Continue conservative management of rest, ice, and gentle stretches Take naproxen as needed for pain relief (may cause abdominal discomfort, ulcers, and GI bleeds avoid taking with other NSAIDs) Follow up with PCP if symptoms persist Return or go to the ER if you have any new or worsening symptoms (fever, chills, chest pain, abdominal pain, changes in bowel or bladder habits, pain radiating into lower legs)   Reviewed expectations re: course of current medical issues. Questions answered. Outlined signs and symptoms indicating need for more acute intervention. Patient verbalized understanding. After Visit Summary given.       Moshe Cipro, NP 04/15/20 1830

## 2020-05-19 IMAGING — MG DIGITAL DIAGNOSTIC BILATERAL MAMMOGRAM WITH TOMO AND CAD
8 series · 9 of 24 positions shown · non-contrast
Comparison: Previous exam(s).

CLINICAL DATA: The patient had a recent skin lesion, either a boil
or insect bite, which has resolved. Family history of breast cancer.

EXAM:
DIGITAL DIAGNOSTIC BILATERAL MAMMOGRAM WITH CAD AND TOMO

[L MLO synth-2D]
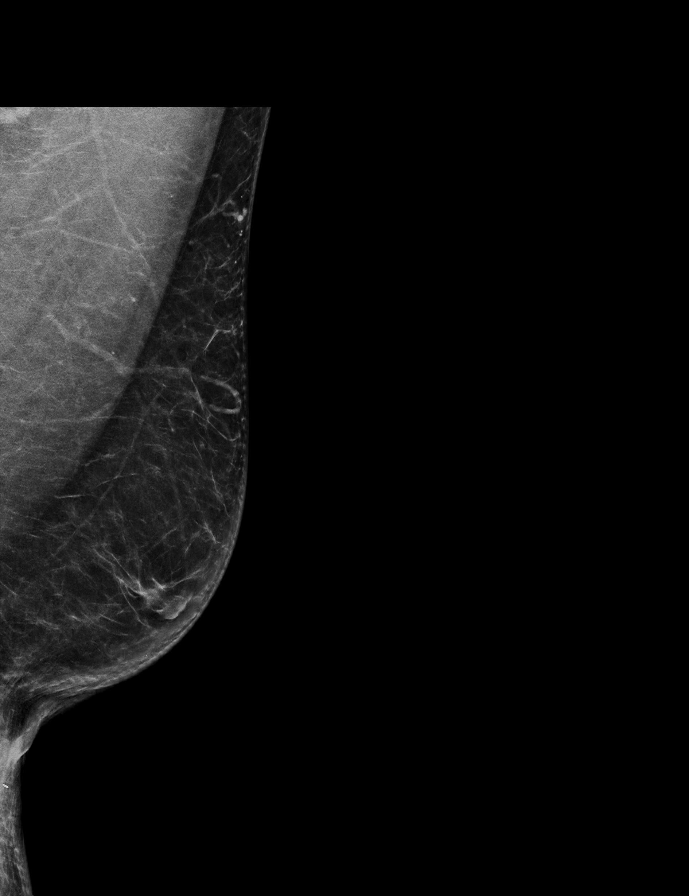

[R MLO synth-2D]
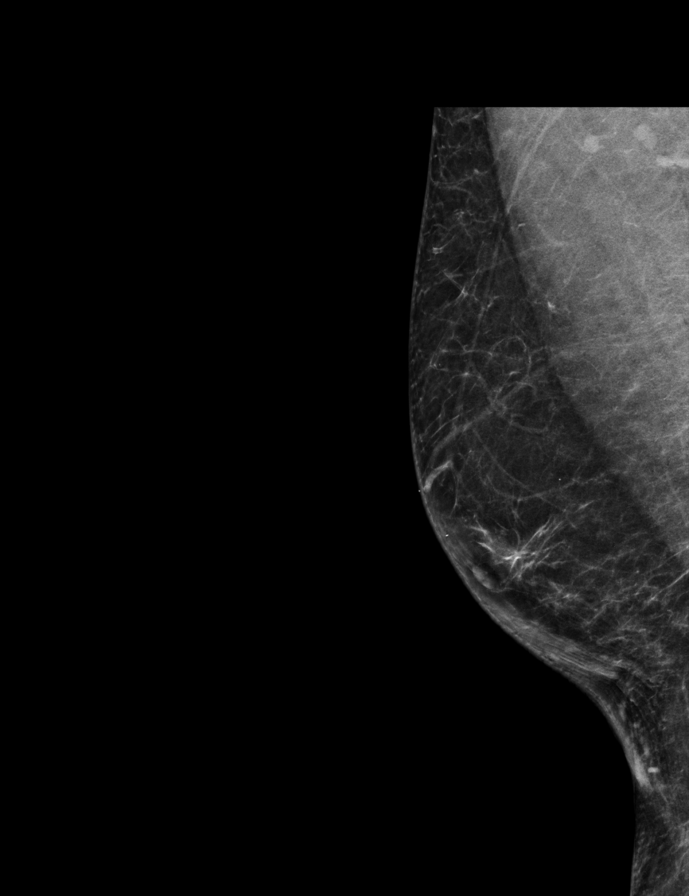

[R CC synth-2D]
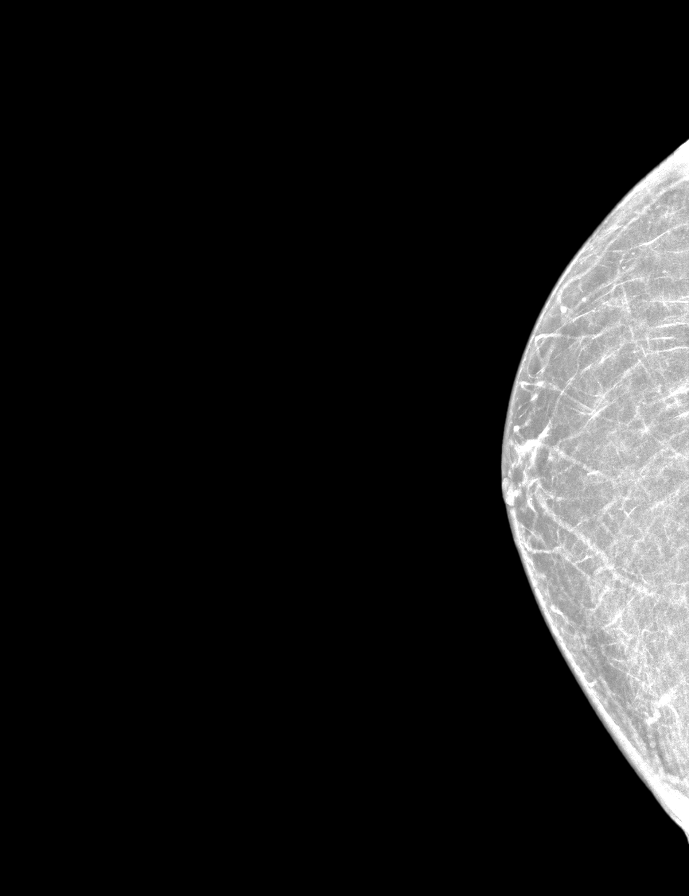

[L CC synth-2D]
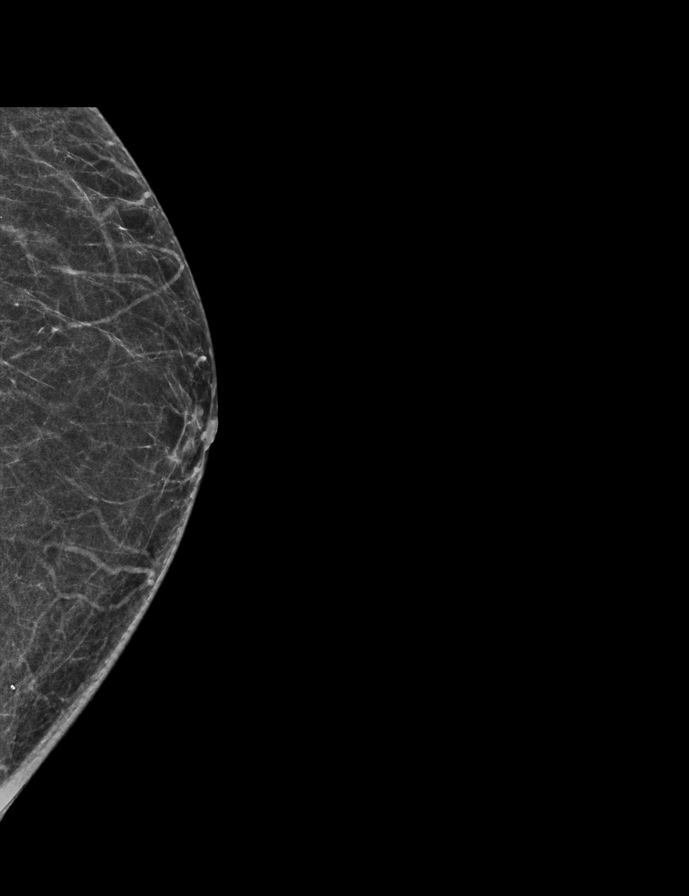

[L MLO tomo · 2 of 62 frames shown]
[frame 21/62]
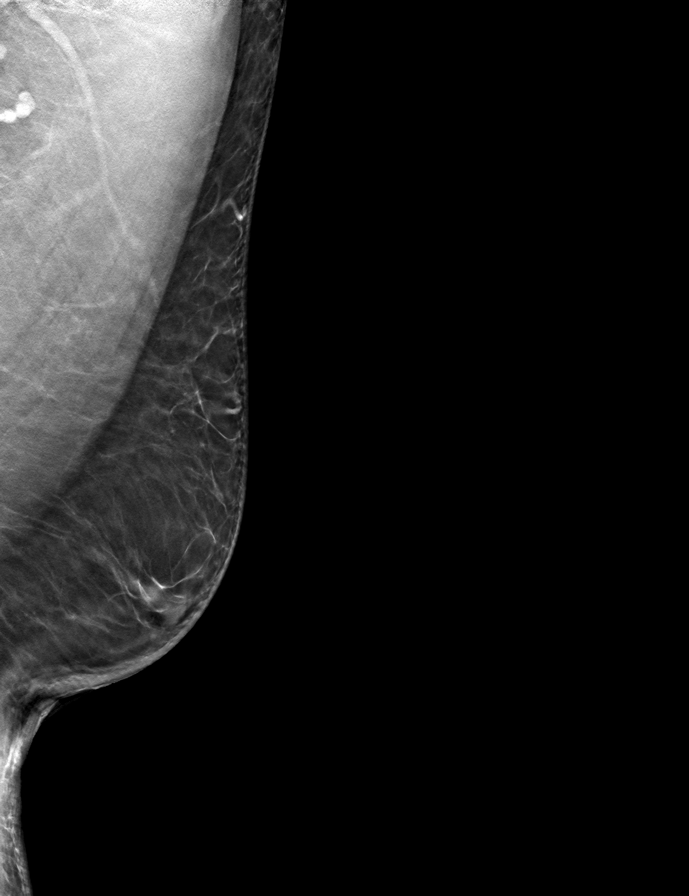
[frame 31/62]
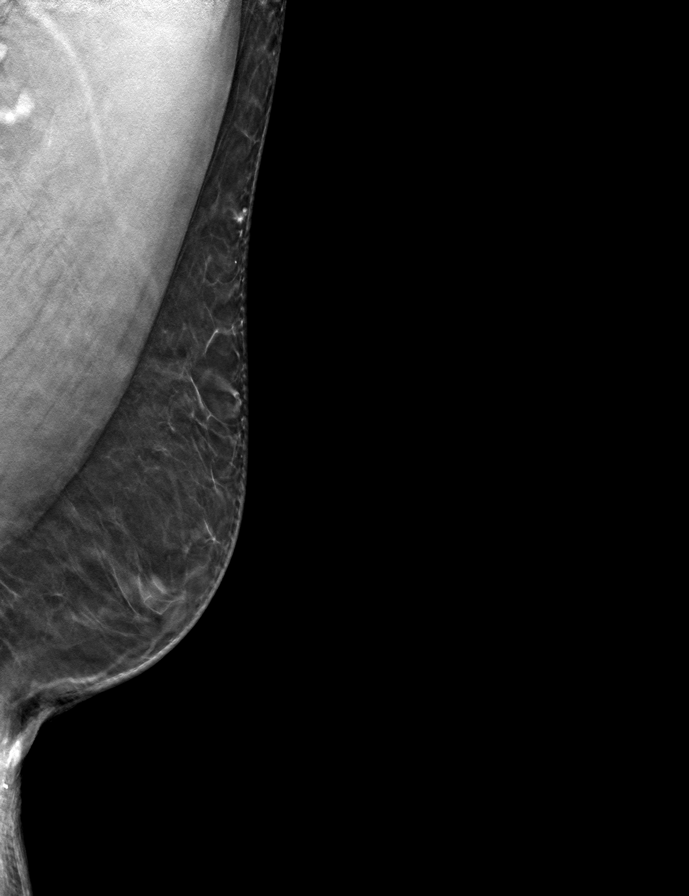

[L CC tomo · tomo slice 25/48.0]
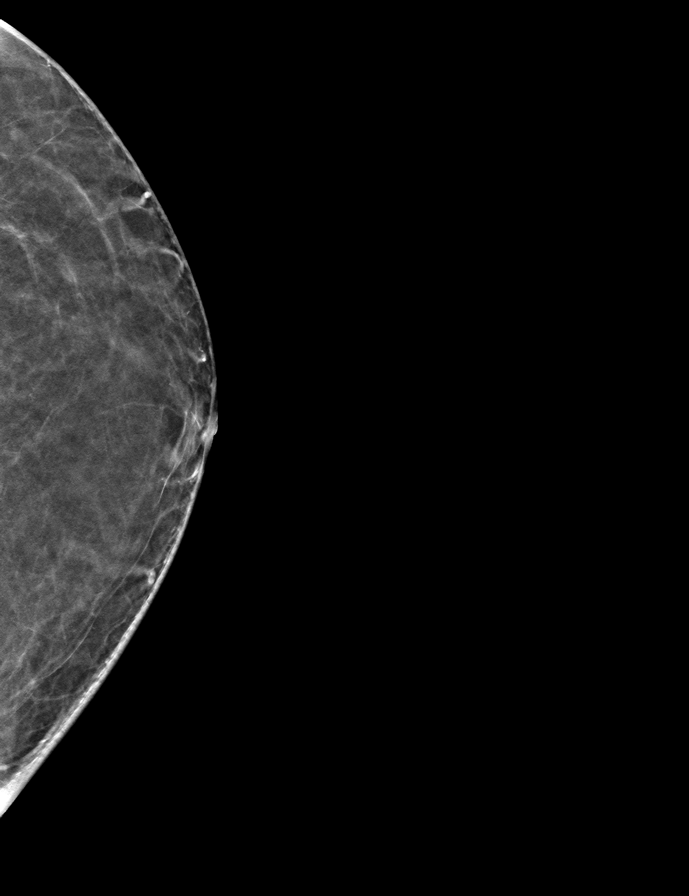

[R CC tomo · tomo slice 27/52.0]
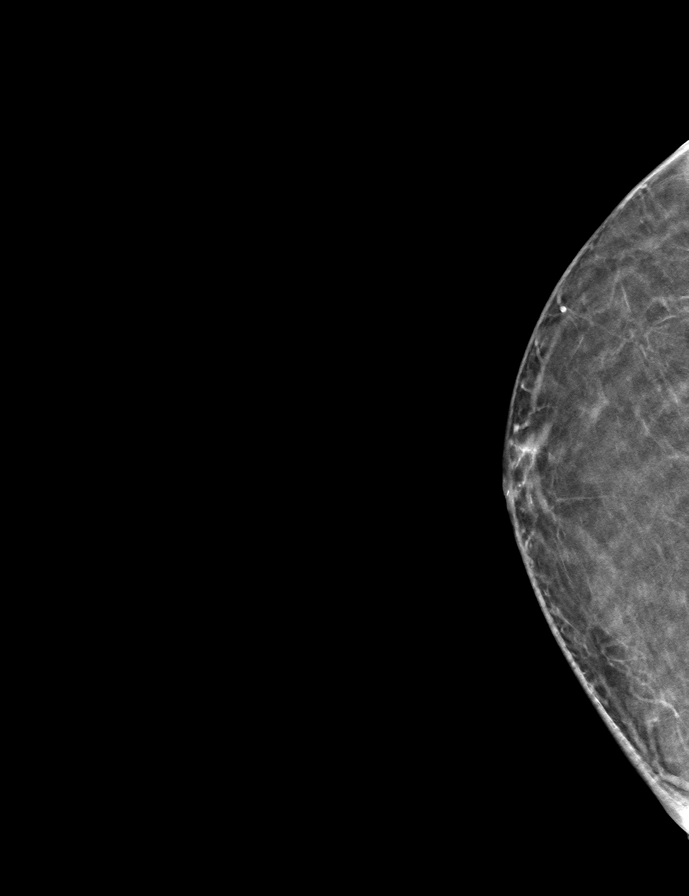

[R MLO tomo · tomo slice 29/56.0]
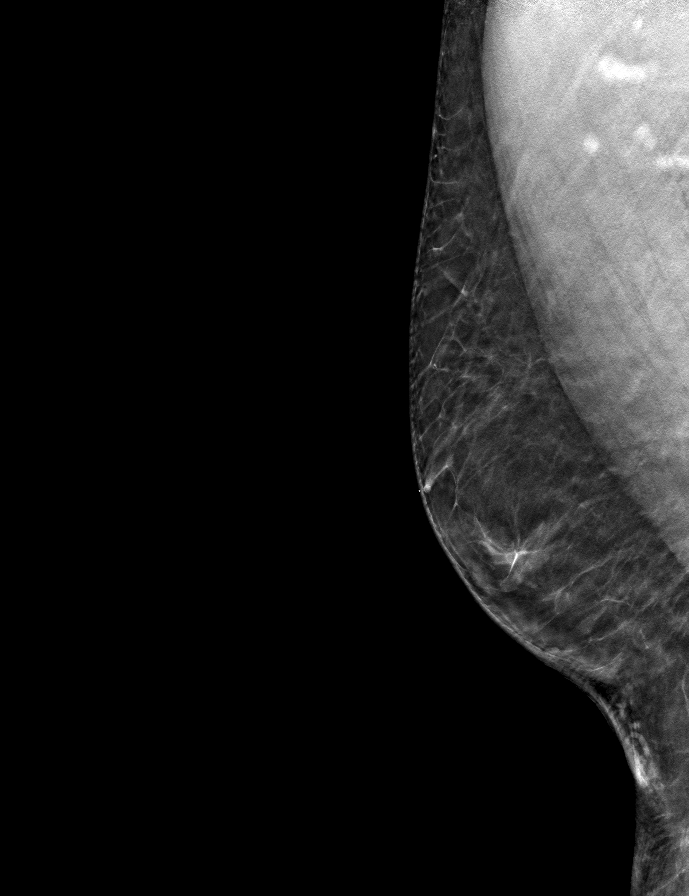

[9 of 24 positions shown; findings below may reference images not displayed]

ACR Breast Density Category b: There are scattered areas of
fibroglandular density.
FINDINGS: No suspicious masses, calcifications, or distortion identified.

Mammographic images were processed with CAD.
IMPRESSION: No mammographic evidence of malignancy.

RECOMMENDATION:
No imaging follow-up necessary.

I have discussed the findings and recommendations with the patient.
Results were also provided in writing at the conclusion of the
visit. If applicable, a reminder letter will be sent to the patient
regarding the next appointment.

BI-RADS CATEGORY  1: Negative.

## 2022-12-03 ENCOUNTER — Ambulatory Visit (INDEPENDENT_AMBULATORY_CARE_PROVIDER_SITE_OTHER): Payer: 59

## 2022-12-03 ENCOUNTER — Ambulatory Visit: Payer: 59 | Admitting: Podiatry

## 2022-12-03 DIAGNOSIS — M7661 Achilles tendinitis, right leg: Secondary | ICD-10-CM | POA: Diagnosis not present

## 2022-12-03 DIAGNOSIS — M79671 Pain in right foot: Secondary | ICD-10-CM

## 2022-12-03 DIAGNOSIS — Q666 Other congenital valgus deformities of feet: Secondary | ICD-10-CM | POA: Diagnosis not present

## 2022-12-03 DIAGNOSIS — G8929 Other chronic pain: Secondary | ICD-10-CM | POA: Diagnosis not present

## 2022-12-03 DIAGNOSIS — M778 Other enthesopathies, not elsewhere classified: Secondary | ICD-10-CM | POA: Diagnosis not present

## 2022-12-03 MED ORDER — METHYLPREDNISOLONE 4 MG PO TBPK: ORAL_TABLET | ORAL | 0 refills | Status: AC

## 2022-12-03 NOTE — Progress Notes (Unsigned)
Subjective:   Patient ID: Phillip Lara, male   DOB: 62 y.o.   MRN: [id number]   HPI Chief Complaint  Patient presents with   Foot Pain    Right foot, heel pain, patient has flat feet, prior treatment includes foot creams, shoes, meloxicam from urgent care, etc 3 weeks ago    He has had flaftot and he has been getting some discomfort along thearh arch intermittely. It happens about 1 time a year and last for a few days then goes away. This time it started on the left foot in feb and he took NSAIDs, soaking, pain creams  and after 3 weeks left started to feel better. [facility] the right heel started hurting and it has been hriing since. It did swell a few times. He had a bump on the back of hte heel. It has been painful to put in shoes. He took dr. Matthias Hughs inserts. He feels the left side is 90% better but hte right is still bothering him. It is not as bad as it was 48 hrs ago. No injuries. No numbness or tingling.    ROS      Objective:  Physical Exam  ***     Assessment:  ***     Plan:  ***

## 2022-12-03 NOTE — Patient Instructions (Signed)

## 2022-12-06 ENCOUNTER — Telehealth: Payer: Self-pay | Admitting: *Deleted

## 2022-12-06 NOTE — Telephone Encounter (Signed)
error 

## 2022-12-24 ENCOUNTER — Telehealth: Payer: Self-pay | Admitting: Podiatry

## 2022-12-24 NOTE — Telephone Encounter (Signed)
Left message on voicemail for patient to call back , to see if he wanted to proceed with orthotics,  his insurance sent back documentation that no prior authorization is required.  When I called for predetermination they said that it would base on his deductible and medical necessity.   Documentation from [facility] portal to be sent to scan center.    Will hold casting box for 1 month before disposing of.

## 2022-12-26 NOTE — Telephone Encounter (Signed)
Patient left message on voicemail that he did not want to proceed with order.  His insurance will not cover the whole amount of orthotics.
